# Patient Record
Sex: Female | Born: 1965 | Race: Black or African American | Hispanic: No | Smoking: Never smoker
Health system: Southern US, Community
[De-identification: ages and names within clinical notes are randomized; demographics above are authoritative.]

## PROBLEM LIST (undated history)

## (undated) DIAGNOSIS — G43909 Migraine, unspecified, not intractable, without status migrainosus: Secondary | ICD-10-CM

## (undated) DIAGNOSIS — M549 Dorsalgia, unspecified: Secondary | ICD-10-CM

## (undated) DIAGNOSIS — M1990 Unspecified osteoarthritis, unspecified site: Secondary | ICD-10-CM

## (undated) HISTORY — PX: GASTRIC BYPASS: SHX52

## (undated) HISTORY — PX: BACK SURGERY: SHX140

## (undated) HISTORY — PX: OTHER SURGICAL HISTORY: SHX169

---

## 2012-10-22 ENCOUNTER — Ambulatory Visit: Payer: Self-pay | Admitting: Emergency Medicine

## 2012-10-22 LAB — RAPID STREP-A WITH REFLX: Micro Text Report: NEGATIVE

## 2012-10-22 LAB — RAPID INFLUENZA A&B ANTIGENS

## 2012-10-25 LAB — BETA STREP CULTURE(ARMC)

## 2012-10-26 ENCOUNTER — Ambulatory Visit: Payer: Self-pay | Admitting: Emergency Medicine

## 2012-12-02 ENCOUNTER — Ambulatory Visit: Payer: Self-pay | Admitting: Physician Assistant

## 2012-12-02 LAB — RAPID STREP-A WITH REFLX: Micro Text Report: NEGATIVE

## 2012-12-04 LAB — BETA STREP CULTURE(ARMC)

## 2015-05-11 ENCOUNTER — Telehealth: Payer: Self-pay

## 2015-05-11 NOTE — Telephone Encounter (Signed)
ENCOUNTER OPENED IN ERROR

## 2015-11-14 ENCOUNTER — Ambulatory Visit
Admission: EM | Admit: 2015-11-14 | Discharge: 2015-11-14 | Disposition: A | Discharge status: Home / Self Care | Payer: Federal, State, Local not specified - PPO | Attending: Family Medicine | Admitting: Family Medicine

## 2015-11-14 DIAGNOSIS — R05 Cough: Secondary | ICD-10-CM

## 2015-11-14 DIAGNOSIS — J020 Streptococcal pharyngitis: Secondary | ICD-10-CM | POA: Diagnosis not present

## 2015-11-14 DIAGNOSIS — R059 Cough, unspecified: Secondary | ICD-10-CM

## 2015-11-14 LAB — RAPID INFLUENZA A&B ANTIGENS: Influenza A (ARMC): NOT DETECTED

## 2015-11-14 LAB — RAPID INFLUENZA A&B ANTIGENS (ARMC ONLY): INFLUENZA B (ARMC): NOT DETECTED

## 2015-11-14 LAB — RAPID STREP SCREEN (MED CTR MEBANE ONLY): STREPTOCOCCUS, GROUP A SCREEN (DIRECT): POSITIVE — AB

## 2015-11-14 MED ORDER — AZITHROMYCIN 250 MG PO TABS: ORAL_TABLET | ORAL | Status: DC

## 2015-11-14 MED ORDER — GUAIFENESIN-CODEINE 100-10 MG/5ML PO SOLN: ORAL | Status: DC

## 2015-11-14 NOTE — ED Notes (Signed)
Patient was in our office this past Monday with her two daughters who were diagnosed with strep and one was diagnosed with the flu.  She c/o cough, runny nose, headache, sore throat, but denies fever/c/n/v or chest pain.  These symptoms started Thursday.

## 2015-11-14 NOTE — ED Provider Notes (Signed)
CSN: [id number]     Arrival date & time 11/14/15  8469 History   First MD Initiated Contact with Patient 11/14/15 1036     Chief Complaint  Patient presents with  . Sore Throat  . Cough   (Consider location/radiation/quality/duration/timing/severity/associated sxs/prior Treatment) Patient is a 50 y.o. female presenting with URI. The history is provided by the patient.  URI Presenting symptoms: congestion, cough, fever, rhinorrhea and sore throat   Severity:  Moderate Onset quality:  Sudden Duration:  3 days Timing:  Constant Progression:  Worsening Chronicity:  New Relieved by:  Nothing Ineffective treatments:  OTC medications Associated symptoms: no headaches, no sinus pain, no sneezing and no wheezing   Risk factors: sick contacts (one daughter with flu and another with strep)     History reviewed. No pertinent past medical history. Past Surgical History  Procedure Laterality Date  . Gastric bypass    . Back surgery    . Left foot surgery     History reviewed. No pertinent family history. Social History  Substance Use Topics  . Smoking status: Never Smoker   . Smokeless tobacco: Never Used  . Alcohol Use: No   OB History    No data available     Review of Systems  Constitutional: Positive for fever.  HENT: Positive for congestion, rhinorrhea and sore throat. Negative for sneezing.   Respiratory: Positive for cough. Negative for wheezing.   Neurological: Negative for headaches.    Allergies  Ace inhibitors and Penicillins  Home Medications   Prior to Admission medications   Medication Sig Start Date End Date Taking? Authorizing Provider  pantoprazole (PROTONIX) 20 MG tablet Take 20 mg by mouth daily.   Yes Historical Provider, MD  topiramate (TOPAMAX) 50 MG tablet Take 50 mg by mouth 2 (two) times daily.   Yes Historical Provider, MD  zolpidem (AMBIEN) 5 MG tablet Take 5 mg by mouth at bedtime as needed for sleep.   Yes Historical Provider, MD  azithromycin  (ZITHROMAX Z-PAK) 250 MG tablet 2 tabs po once day 1, then 1 tab po qd for next 4 days 11/14/15   Payton Mccallum, MD  guaiFENesin-codeine 100-10 MG/5ML syrup 10 ml po q 8 hours prn 11/14/15   Payton Mccallum, MD  HYDROcodone-acetaminophen (NORCO/VICODIN) 5-325 MG tablet Take 1 tablet by mouth every 6 (six) hours as needed for moderate pain.    Historical Provider, MD   Meds Ordered and Administered this Visit  Medications - No data to display  BP 124/91 mmHg  Pulse 84  Temp(Src) 98.6 F (37 C) (Oral)  Resp 18  Ht 5' 4.5" (1.638 m)  Wt 220 lb (99.791 kg)  BMI 37.19 kg/m2  SpO2 100% No data found.   Physical Exam  Constitutional: She appears well-developed and well-nourished. No distress.  HENT:  Head: Normocephalic and atraumatic.  Right Ear: Tympanic membrane, external ear and ear canal normal.  Left Ear: Tympanic membrane, external ear and ear canal normal.  Nose: Rhinorrhea present. No nose lacerations, sinus tenderness, nasal deformity, septal deviation or nasal septal hematoma. No epistaxis.  No foreign bodies.  Mouth/Throat: Uvula is midline and mucous membranes are normal. Posterior oropharyngeal erythema present. No oropharyngeal exudate, posterior oropharyngeal edema or tonsillar abscesses.  Eyes: Conjunctivae and EOM are normal. Pupils are equal, round, and reactive to light. Right eye exhibits no discharge. Left eye exhibits no discharge. No scleral icterus.  Neck: Normal range of motion. Neck supple. No thyromegaly present.  Cardiovascular: Normal rate, regular rhythm  and normal heart sounds.   Pulmonary/Chest: Effort normal and breath sounds normal. No respiratory distress. She has no wheezes. She has no rales.  Lymphadenopathy:    She has no cervical adenopathy.  Skin: She is not diaphoretic.  Nursing note and vitals reviewed.   ED Course  Procedures (including critical care time)  Labs Review Labs Reviewed  RAPID STREP SCREEN (NOT AT [facility]) - Abnormal; Notable for  the following:    Streptococcus, Group A Screen (Direct) POSITIVE (*)    All other components within normal limits  RAPID INFLUENZA A&B ANTIGENS ([facility] ONLY)    Imaging Review No results found.   Visual Acuity Review  Right Eye Distance:   Left Eye Distance:   Bilateral Distance:    Right Eye Near:   Left Eye Near:    Bilateral Near:         MDM   1. Strep pharyngitis   2. Cough    Discharge Medication List as of 11/14/2015 11:09 AM    START taking these medications   Details  azithromycin (ZITHROMAX Z-PAK) 250 MG tablet 2 tabs po once day 1, then 1 tab po qd for next 4 days, Normal    guaiFENesin-codeine 100-10 MG/5ML syrup 10 ml po q 8 hours prn, Print       1. Lab result (rapid strep positive) and diagnosis reviewed with patient 2. rx as per orders above; reviewed possible side effects, interactions, risks and benefits  3. Recommend supportive treatment with otc analgesics, salt water gargles 4. Follow-up prn if symptoms worsen or don't improve    Payton Mccallum, MD 11/14/15 1114

## 2016-04-26 ENCOUNTER — Encounter: Payer: Self-pay | Admitting: *Deleted

## 2016-04-26 ENCOUNTER — Ambulatory Visit
Admission: EM | Admit: 2016-04-26 | Discharge: 2016-04-26 | Disposition: A | Discharge status: Home / Self Care | Payer: Federal, State, Local not specified - PPO | Attending: Family Medicine | Admitting: Family Medicine

## 2016-04-26 DIAGNOSIS — J069 Acute upper respiratory infection, unspecified: Secondary | ICD-10-CM

## 2016-04-26 MED ORDER — BENZONATATE 100 MG PO CAPS: 100.0000 mg | ORAL_CAPSULE | Freq: Three times a day (TID) | ORAL | 0 refills | Status: DC | PRN

## 2016-04-26 MED ORDER — HYDROCOD POLST-CPM POLST ER 10-8 MG/5ML PO SUER: 5.0000 mL | Freq: Every evening | ORAL | 0 refills | Status: DC | PRN

## 2016-04-26 NOTE — Discharge Instructions (Signed)
Take medication as prescribed. Rest. Drink plenty of fluids.  ° °Follow up with your primary care physician this week as needed. Return to Urgent care for new or worsening concerns.  ° °

## 2016-04-26 NOTE — ED Provider Notes (Signed)
[facility] ____________________________________________  Time seen: Approximately 10:15 AM  I have reviewed the triage vital signs and the nursing notes.   HISTORY  Chief Complaint Cough and Sore Throat   HPI Whitney Ware is a 50 y.o. female presents for the complaint of cough and nasal congestion 2-3 days. Patient reports some runny nose but states the cough is her biggest complaint. Patient states that cough is a dry hacking nonproductive cough. Patient reports a cough does intermittently wake her from her sleep. Denies any wheezing. Patient states that the cough is variable intermittent. Denies any chest pain or shortness of breath. Denies fevers. Reports continues to eat and drink well. Denies others are sick around her. Patient reports cough and resolved with over-the-counter Mucinex.  Denies chest pain, shortness of breath, chest pain with deep breath, abdominal pain, dysuria, neck pain, back pain, extremity pain, shortness swelling, recent sickness or recent hospitalization.   History reviewed. No pertinent past medical history.  There are no active problems to display for this patient.   Past Surgical History:  Procedure Laterality Date  . BACK SURGERY    . GASTRIC BYPASS    . Left Foot Surgery      No current facility-administered medications for this encounter.   Current Outpatient Prescriptions:  .  guaiFENesin-codeine 100-10 MG/5ML syrup, 10 ml po q 8 hours prn, Disp: 120 mL, Rfl: 0 .  HYDROcodone-acetaminophen (NORCO/VICODIN) 5-325 MG tablet, Take 1 tablet by mouth every 6 (six) hours as needed for moderate pain., Disp: , Rfl:  .  pantoprazole (PROTONIX) 20 MG tablet, Take 20 mg by mouth daily., Disp: , Rfl:  .  tiZANidine (ZANAFLEX) 2 MG tablet, Take 2 mg by mouth every 6 (six) hours as needed for muscle spasms., Disp: , Rfl:  .  topiramate (TOPAMAX) 50 MG tablet, Take 50 mg by mouth 2 (two) times daily., Disp: , Rfl:  .  zolpidem (AMBIEN) 5  MG tablet, Take 5 mg by mouth at bedtime as needed for sleep., Disp: , Rfl:  .  azithromycin (ZITHROMAX Z-PAK) 250 MG tablet, 2 tabs po once day 1, then 1 tab po qd for next 4 days, Disp: 6 each, Rfl: 0 .  benzonatate (TESSALON PERLES) 100 MG capsule, Take 1 capsule (100 mg total) by mouth 3 (three) times daily as needed for cough., Disp: 15 capsule, Rfl: 0 .  chlorpheniramine-HYDROcodone (TUSSIONEX PENNKINETIC ER) 10-8 MG/5ML SUER, Take 5 mLs by mouth at bedtime as needed for cough. do not drive or operate machinery while taking as can cause drowsiness., Disp: 100 mL, Rfl: 0  Allergies Ace inhibitors and Penicillins  Family History  Problem Relation Age of Onset  . Hypertension Mother   . Cancer Mother   . Diabetes Father   . Cancer Father     Social History Social History  Substance Use Topics  . Smoking status: Never Smoker  . Smokeless tobacco: Never Used  . Alcohol use No    Review of Systems Constitutional: No fever/chills Eyes: No visual changes. ENT: States mild scratchy throat only when coughing. As above. Cardiovascular: Denies chest pain. Respiratory: Denies shortness of breath. Gastrointestinal: No abdominal pain.  No nausea, no vomiting.  No diarrhea.  No constipation. Genitourinary: Negative for dysuria. Musculoskeletal: Negative for back pain. Skin: Negative for rash. Neurological: Negative for headaches, focal weakness or numbness.  10-point ROS otherwise negative.  ____________________________________________   PHYSICAL EXAM:  VITAL SIGNS: ED Triage Vitals  Enc Vitals Group  BP 04/26/16 0854 (!) 143/94     Pulse Rate 04/26/16 0854 81     Resp 04/26/16 0854 18     Temp 04/26/16 0854 97.9 F (36.6 C)     Temp Source 04/26/16 0854 Tympanic     SpO2 04/26/16 0854 100 %     Weight 04/26/16 0854 224 lb (101.6 kg)     Height 04/26/16 0854 5' 4.5" (1.638 m)     Head Circumference --      Peak Flow --      Pain Score 04/26/16 0858 0     Pain Loc --       Pain Edu? --      Excl. in GC? --     Constitutional: Alert and oriented. Well appearing and in no acute distress. Eyes: Conjunctivae are normal. PERRL. EOMI. Head: Atraumatic. No sinus tenderness to palpation. No swelling. No erythema.  Ears: no erythema, normal TMs bilaterally.   Nose:Nasal congestion with clear rhinorrhea  Mouth/Throat: Mucous membranes are moist. No pharyngeal erythema. No tonsillar swelling or exudate.  Neck: No stridor.  No cervical spine tenderness to palpation. Hematological/Lymphatic/Immunilogical: No cervical lymphadenopathy. Cardiovascular: Normal rate, regular rhythm. Grossly normal heart sounds.  Good peripheral circulation. Respiratory: Normal respiratory effort.  No retractions. Lungs CTAB.No wheezes, rales or rhonchi. Good air movement. Occasional dry intermittent cough noted in room. Gastrointestinal: Soft and nontender. Normal Bowel sounds. No CVA tenderness. Musculoskeletal: No lower or upper extremity tenderness nor edema. No cervical, thoracic or lumbar tenderness to palpation. No calf tenderness bilaterally. Neurologic:  Normal speech and language. No gross focal neurologic deficits are appreciated. No gait instability. Skin:  Skin is warm, dry and intact. No rash noted. Psychiatric: Mood and affect are normal. Speech and behavior are normal.  ___________________________________________   LABS (all labs ordered are listed, but only abnormal results are displayed)  Labs Reviewed - No data to display  PROCEDURES Procedures   ________________________________________   INITIAL IMPRESSION / ASSESSMENT AND PLAN / ED COURSE  Pertinent labs & imaging results that were available during my care of the patient were reviewed by me and considered in my medical decision making (see chart for details).  Well-appearing patient. No acute distress. Presents with a complaint of 2-3 days of runny nose and cough. Lungs clear throughout. Well-appearing  patient. Suspect viral upper respiratory infection. We'll treats supportively and symptomatically. Will treat with parent Tessalon Perles during the day and when necessary Tussionex at night. Encourage rest, fluids and PCP follow-up as needed.  Discussed follow up with Primary care physician this week. Discussed follow up and return parameters including no resolution or any worsening concerns. Patient verbalized understanding and agreed to plan.   ____________________________________________   FINAL CLINICAL IMPRESSION(S) / ED DIAGNOSES  Final diagnoses:  Upper respiratory infection     Discharge Medication List as of 04/26/2016  9:22 AM    START taking these medications   Details  benzonatate (TESSALON PERLES) 100 MG capsule Take 1 capsule (100 mg total) by mouth 3 (three) times daily as needed for cough., Starting Tue 04/26/2016, Normal    chlorpheniramine-HYDROcodone (TUSSIONEX PENNKINETIC ER) 10-8 MG/5ML SUER Take 5 mLs by mouth at bedtime as needed for cough. do not drive or operate machinery while taking as can cause drowsiness., Starting Tue 04/26/2016, Print        Note: This dictation was prepared with Dragon dictation along with smaller phrase technology. Any transcriptional errors that result from this process are unintentional.    Clinical  Course      Renford Dills, NP 04/26/16 1020

## 2016-04-26 NOTE — ED Triage Notes (Signed)
Patient started having symptoms of cough and sore throat 3 days ago. OTC medications have not resolved symptoms.

## 2016-09-23 ENCOUNTER — Encounter: Payer: Self-pay | Admitting: Emergency Medicine

## 2016-09-23 ENCOUNTER — Ambulatory Visit
Admission: EM | Admit: 2016-09-23 | Discharge: 2016-09-23 | Disposition: A | Discharge status: Home / Self Care | Payer: Federal, State, Local not specified - PPO | Attending: Family Medicine | Admitting: Family Medicine

## 2016-09-23 DIAGNOSIS — G43009 Migraine without aura, not intractable, without status migrainosus: Secondary | ICD-10-CM

## 2016-09-23 MED ORDER — KETOROLAC TROMETHAMINE 60 MG/2ML IM SOLN
60.0000 mg | Freq: Once | INTRAMUSCULAR | Status: AC
  Administered 2016-09-23: 60 mg via INTRAMUSCULAR

## 2016-09-23 NOTE — ED Triage Notes (Signed)
Patient c/o headache that started this morning.  Patient reports history of Migraine.  Patient states that she has already taken her migraine medicine today and has not helped. Patient denies N/V.  Patient denies light or sound sensitivity

## 2016-09-23 NOTE — ED Provider Notes (Signed)
[facility]    CSN: [id number] Arrival date & time: 09/23/16  1435     History   Chief Complaint Chief Complaint  Patient presents with  . Headache    HPI Whitney Ware is a 50 y.o. female.   50 yo female with a h/o migraines presents with a c/o migraine headache since this morning unrelieved with her usual medication. Migraine is on the left side and associated with mild photophobia. Denies any fevers, chills, vision changes, numbness/tingling.     The history is provided by the patient.  Headache    History reviewed. No pertinent past medical history.  There are no active problems to display for this patient.   Past Surgical History:  Procedure Laterality Date  . BACK SURGERY    . GASTRIC BYPASS    . Left Foot Surgery      OB History    No data available       Home Medications    Prior to Admission medications   Medication Sig Start Date End Date Taking? Authorizing Provider  baclofen (LIORESAL) 20 MG tablet Take 20 mg by mouth 3 (three) times daily.   Yes Historical Provider, MD  butalbital-aspirin-caffeine-codeine (FIORINAL WITH CODEINE) 50-325-40-30 MG capsule Take 2 capsules by mouth every 4 (four) hours as needed for pain.   Yes Historical Provider, MD  pantoprazole (PROTONIX) 20 MG tablet Take 20 mg by mouth daily.    Historical Provider, MD  topiramate (TOPAMAX) 50 MG tablet Take 50 mg by mouth 2 (two) times daily.    Historical Provider, MD  zolpidem (AMBIEN) 5 MG tablet Take 5 mg by mouth at bedtime as needed for sleep.    Historical Provider, MD    Family History Family History  Problem Relation Age of Onset  . Hypertension Mother   . Cancer Mother   . Diabetes Father   . Cancer Father     Social History Social History  Substance Use Topics  . Smoking status: Never Smoker  . Smokeless tobacco: Never Used  . Alcohol use No     Allergies   Ace inhibitors and Penicillins   Review of Systems Review of Systems    Neurological: Positive for headaches.     Physical Exam Triage Vital Signs ED Triage Vitals  Enc Vitals Group     BP 09/23/16 1453 (!) 160/93     Pulse Rate 09/23/16 1453 68     Resp 09/23/16 1453 16     Temp 09/23/16 1453 98.6 F (37 C)     Temp Source 09/23/16 1453 Oral     SpO2 09/23/16 1453 100 %     Weight 09/23/16 1451 230 lb (104.3 kg)     Height 09/23/16 1451 5\' 4"  (1.626 m)     Head Circumference --      Peak Flow --      Pain Score 09/23/16 1453 6     Pain Loc --      Pain Edu? --      Excl. in GC? --    No data found.   Updated Vital Signs BP (!) 158/95 (BP Location: Left Arm)   Pulse 68   Temp 98.6 F (37 C) (Oral)   Resp 16   Ht 5\' 4"  (1.626 m)   Wt 230 lb (104.3 kg)   SpO2 100%   BMI 39.48 kg/m   Visual Acuity Right Eye Distance:   Left Eye Distance:   Bilateral Distance:  Right Eye Near:   Left Eye Near:    Bilateral Near:     Physical Exam  Constitutional: She is oriented to person, place, and time. She appears well-developed and well-nourished. No distress.  HENT:  Head: Normocephalic and atraumatic.  Right Ear: External ear normal.  Left Ear: External ear normal.  Nose: No mucosal edema, rhinorrhea, nose lacerations, sinus tenderness, nasal deformity, septal deviation or nasal septal hematoma. No epistaxis.  No foreign bodies. Right sinus exhibits no maxillary sinus tenderness and no frontal sinus tenderness. Left sinus exhibits no maxillary sinus tenderness and no frontal sinus tenderness.  Mouth/Throat: Uvula is midline, oropharynx is clear and moist and mucous membranes are normal. No oropharyngeal exudate.  Eyes: Conjunctivae and EOM are normal. Pupils are equal, round, and reactive to light. Right eye exhibits no discharge. Left eye exhibits no discharge. No scleral icterus.  Neck: Normal range of motion. Neck supple. No thyromegaly present.  Cardiovascular: Normal rate, regular rhythm and normal heart sounds.   Pulmonary/Chest:  Effort normal and breath sounds normal. No respiratory distress. She has no wheezes. She has no rales.  Lymphadenopathy:    She has no cervical adenopathy.  Neurological: She is alert and oriented to person, place, and time. She displays normal reflexes. No cranial nerve deficit or sensory deficit. She exhibits normal muscle tone. Coordination normal.  Skin: She is not diaphoretic.  Nursing note and vitals reviewed.    UC Treatments / Results  Labs (all labs ordered are listed, but only abnormal results are displayed) Labs Reviewed - No data to display  EKG  EKG Interpretation None       Radiology No results found.  Procedures Procedures (including critical care time)  Medications Ordered in UC Medications  ketorolac (TORADOL) injection 60 mg (60 mg Intramuscular Given 09/23/16 1517)     Initial Impression / Assessment and Plan / UC Course  I have reviewed the triage vital signs and the nursing notes.  Pertinent labs & imaging results that were available during my care of the patient were reviewed by me and considered in my medical decision making (see chart for details).  Clinical Course       Final Clinical Impressions(s) / UC Diagnoses   Final diagnoses:  Migraine without aura and without status migrainosus, not intractable    New Prescriptions Discharge Medication List as of 09/23/2016  4:05 PM      1. diagnosis reviewed with patient 2. Patient given toradol 60mg  im x 1 with resolution of migraine 3. Follow-up prn if symptoms recur   Payton Mccallumrlando Melenie Minniear, MD 09/23/16 2050

## 2017-03-21 ENCOUNTER — Ambulatory Visit (INDEPENDENT_AMBULATORY_CARE_PROVIDER_SITE_OTHER): Payer: Federal, State, Local not specified - PPO

## 2017-03-21 ENCOUNTER — Ambulatory Visit
Admission: EM | Admit: 2017-03-21 | Discharge: 2017-03-21 | Disposition: A | Discharge status: Home / Self Care | Payer: Federal, State, Local not specified - PPO | Attending: Family Medicine | Admitting: Family Medicine

## 2017-03-21 DIAGNOSIS — S8002XA Contusion of left knee, initial encounter: Secondary | ICD-10-CM | POA: Diagnosis not present

## 2017-03-21 MED ORDER — NAPROXEN 500 MG PO TABS: 500.0000 mg | ORAL_TABLET | Freq: Two times a day (BID) | ORAL | 0 refills | Status: DC

## 2017-03-21 NOTE — ED Provider Notes (Signed)
CSN: [id number]     Arrival date & time 03/21/17  1605 History   First MD Initiated Contact with Patient 03/21/17 1720     Chief Complaint  Patient presents with  . Knee Pain    left    (Consider location/radiation/quality/duration/timing/severity/associated sxs/prior Treatment) HPI   This 51 year old female who presents with a left knee injury. She states that at about 6 days ago she hit her knee on a  table pedestal while pushing  pushing her chair forward. Since that time she's had pain mostly on the proximal anterior tibia. She's been elevating it, keeping ice on it and wrapping it in an Ace wrap but despite this has  been having pain that does not seem to be improving. Has been using Voltaren gel. Has anterior knee pain and indicating the anterior tibia however she stands up quickly from a sitting position or ascending stairs. Not as painful descending stairs.        History reviewed. No pertinent past medical history. Past Surgical History:  Procedure Laterality Date  . BACK SURGERY    . GASTRIC BYPASS    . Left Foot Surgery     Family History  Problem Relation Age of Onset  . Hypertension Mother   . Cancer Mother   . Diabetes Father   . Cancer Father    Social History  Substance Use Topics  . Smoking status: Never Smoker  . Smokeless tobacco: Never Used  . Alcohol use No   OB History    No data available     Review of Systems  Constitutional: Positive for activity change. Negative for chills and fatigue.  Musculoskeletal: Positive for arthralgias and gait problem.  All other systems reviewed and are negative.   Allergies  Ace inhibitors and Penicillins  Home Medications   Prior to Admission medications   Medication Sig Start Date End Date Taking? Authorizing Provider  baclofen (LIORESAL) 20 MG tablet Take 20 mg by mouth 3 (three) times daily.    [provider]  butalbital-aspirin-caffeine-codeine Benny Lennert WITH CODEINE) 50-325-40-30 MG capsule  Take 2 capsules by mouth every 4 (four) hours as needed for pain.    [provider]  naproxen (NAPROSYN) 500 MG tablet Take 1 tablet (500 mg total) by mouth 2 (two) times daily with a meal. 03/21/17   Lutricia Feil, PA-C  pantoprazole (PROTONIX) 20 MG tablet Take 20 mg by mouth daily.    [provider]  topiramate (TOPAMAX) 50 MG tablet Take 50 mg by mouth 2 (two) times daily.    [provider]  zolpidem (AMBIEN) 5 MG tablet Take 5 mg by mouth at bedtime as needed for sleep.    [provider]   Meds Ordered and Administered this Visit  Medications - No data to display  BP (!) 129/99 (BP Location: Left Arm)   Pulse 91   Temp 98.2 F (36.8 C) (Oral)   Resp 18   Ht 5' 4.5" (1.638 m)   Wt 230 lb (104.3 kg)   SpO2 100%   BMI 38.87 kg/m  No data found.   Physical Exam  Constitutional: She is oriented to person, place, and time. She appears well-developed and well-nourished. No distress.  HENT:  Head: Normocephalic.  Eyes: Pupils are equal, round, and reactive to light.  Neck: Normal range of motion.  Musculoskeletal: She exhibits tenderness.  Examination of the left knee shows no effusion present. There is no retropatellar tenderness present. There is no positive grind test.  Tenderness is sharply localized to the anterior tibia at the joint line. There is no ligamentous laxity appreciated. She has a strong quad with good control.  Neurological: She is alert and oriented to person, place, and time.  Skin: Skin is warm and dry. She is not diaphoretic.  Psychiatric: She has a normal mood and affect. Her behavior is normal. Judgment and thought content normal.  Nursing note and vitals reviewed.   Urgent Care Course     Procedures (including critical care time)  Labs Review Labs Reviewed - No data to display  Imaging Review Dg Knee Complete 4 Views Left  Result Date: 03/21/2017 CLINICAL DATA:  Struck her LEFT knee on a table last week,  persistent pain despite elevation, ice and Ace wrap, anterior pain just below patella EXAM: LEFT KNEE - COMPLETE 4+ VIEW COMPARISON:  None FINDINGS: Diffuse osseous demineralization. Minimal joint space narrowing with tiny patellar spurs. No acute fracture, dislocation, or bone destruction. No definite knee joint effusion. 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 No acute abnormalities. Electronically Signed   By: Ulyses SouthwardMark  Boles M.D.   On: 03/21/2017 18:22     Visual Acuity Review  Right Eye Distance:   Left Eye Distance:   Bilateral Distance:    Right Eye Near:   Left Eye Near:    Bilateral Near:     Patient was fitted with a left knee immobilizer.    MDM   1. Contusion of left knee, initial encounter    New Prescriptions   NAPROXEN (NAPROSYN) 500 MG TABLET    Take 1 tablet (500 mg total) by mouth 2 (two) times daily with a meal.  Plan: 1. Test/x-ray results and diagnosis reviewed with patient 2. rx as per orders; risks, benefits, potential side effects reviewed with patient 3. Recommend supportive treatment with Isometric quadriceps strengthening exercises. These were instructed to the patient. Stop using Voltaren gel and start using Naprosyn daily. The knee immobilizer for active times. Does not need to sleep with it or use it when she is quiet. If she is not improving she should follow-up with her primary care physician. 4. F/u prn if symptoms worsen or don't improve     Lutricia FeilRoemer, Donye Campanelli P, PA-C 03/21/17 1855    Lutricia FeilRoemer, Audrea Bolte P, PA-C 03/21/17 1858

## 2017-03-21 NOTE — ED Triage Notes (Signed)
Last week pt hit her left knee o a table, she has been elevating it, keeping ice on it, and wearing an ace wrap however the pain isnt getting any better.

## 2018-02-21 ENCOUNTER — Ambulatory Visit
Admission: EM | Admit: 2018-02-21 | Discharge: 2018-02-21 | Disposition: A | Discharge status: Home / Self Care | Payer: Federal, State, Local not specified - PPO | Attending: Family Medicine | Admitting: Family Medicine

## 2018-02-21 ENCOUNTER — Other Ambulatory Visit: Payer: Self-pay

## 2018-02-21 DIAGNOSIS — M79604 Pain in right leg: Secondary | ICD-10-CM

## 2018-02-21 DIAGNOSIS — M7631 Iliotibial band syndrome, right leg: Secondary | ICD-10-CM | POA: Diagnosis not present

## 2018-02-21 HISTORY — DX: Unspecified osteoarthritis, unspecified site: M19.90

## 2018-02-21 HISTORY — DX: Dorsalgia, unspecified: M54.9

## 2018-02-21 HISTORY — DX: Migraine, unspecified, not intractable, without status migrainosus: G43.909

## 2018-02-21 MED ORDER — DICLOFENAC SODIUM 1 % TD GEL: 4.0000 g | Freq: Four times a day (QID) | TRANSDERMAL | 0 refills | Status: DC

## 2018-02-21 MED ORDER — PREDNISONE 20 MG PO TABS: 20.0000 mg | ORAL_TABLET | Freq: Every day | ORAL | 0 refills | Status: DC

## 2018-02-21 NOTE — ED Triage Notes (Signed)
Pt with 2 weeks of right upper leg and right hip pain. Wakes her up at night. Denies injury. "It feels like I strained something but I haven't exercised."  Pain 5/10

## 2018-02-21 NOTE — ED Provider Notes (Signed)
[facility]    CSN: [id number] Arrival date & time: 02/21/18  0841     History   Chief Complaint Chief Complaint  Patient presents with  . Leg Pain    HPI Whitney Ware is a 52 y.o. female.   52 yo female with a c/o right upper lateral leg pain for 2 weeks. Denies any falls, injuries, swelling, rash, redness, numbness/tingling, or calf pain.  Denies any recent surgeries, immobilization, or hormonal therapy.   The history is provided by the patient.    Past Medical History:  Diagnosis Date  . Arthritis   . Back pain   . Migraines     There are no active problems to display for this patient.   Past Surgical History:  Procedure Laterality Date  . BACK SURGERY    . GASTRIC BYPASS    . Left Foot Surgery      OB History   None      Home Medications    Prior to Admission medications   Medication Sig Start Date End Date Taking? Authorizing Provider  gabapentin (NEURONTIN) 300 MG capsule Take 300 mg by mouth 3 (three) times daily as needed.   Yes [provider]  HYDROcodone-acetaminophen (NORCO/VICODIN) 5-325 MG tablet Take 1 tablet by mouth every 6 (six) hours as needed for moderate pain.   Yes [provider]  tizanidine (ZANAFLEX) 2 MG capsule Take 2 mg by mouth 3 (three) times daily.   Yes [provider]  baclofen (LIORESAL) 20 MG tablet Take 20 mg by mouth 3 (three) times daily.    [provider]  butalbital-aspirin-caffeine-codeine Benny Lennert WITH CODEINE) 50-325-40-30 MG capsule Take 2 capsules by mouth every 4 (four) hours as needed for pain.    [provider]  diclofenac sodium (VOLTAREN) 1 % GEL Apply 4 g topically 4 (four) times daily. 02/21/18   Payton Mccallum, MD  naproxen (NAPROSYN) 500 MG tablet Take 1 tablet (500 mg total) by mouth 2 (two) times daily with a meal. 03/21/17   Lutricia Feil, PA-C  pantoprazole (PROTONIX) 20 MG tablet Take 20 mg by mouth daily.    [provider]    predniSONE (DELTASONE) 20 MG tablet Take 1 tablet (20 mg total) by mouth daily. 02/21/18   Payton Mccallum, MD  topiramate (TOPAMAX) 50 MG tablet Take 50 mg by mouth 2 (two) times daily.    [provider]  zolpidem (AMBIEN) 5 MG tablet Take 5 mg by mouth at bedtime as needed for sleep.    [provider]    Family History Family History  Problem Relation Age of Onset  . Hypertension Mother   . Cancer Mother   . Diabetes Father   . Cancer Father     Social History Social History   Tobacco Use  . Smoking status: Never Smoker  . Smokeless tobacco: Never Used  Substance Use Topics  . Alcohol use: No  . Drug use: No     Allergies   Ace inhibitors and Penicillins   Review of Systems Review of Systems   Physical Exam Triage Vital Signs ED Triage Vitals  Enc Vitals Group     BP 02/21/18 0901 125/70     Pulse Rate 02/21/18 0901 73     Resp 02/21/18 0901 18     Temp 02/21/18 0901 98.2 F (36.8 C)     Temp Source 02/21/18 0901 Oral     SpO2 02/21/18 0901 100 %  Weight 02/21/18 0900 230 lb (104.3 kg)     Height 02/21/18 0900 5' 4.5" (1.638 m)     Head Circumference --      Peak Flow --      Pain Score 02/21/18 0900 5     Pain Loc --      Pain Edu? --      Excl. in GC? --    No data found.  Updated Vital Signs BP 125/70 (BP Location: Left Arm)   Pulse 73   Temp 98.2 F (36.8 C) (Oral)   Resp 18   Ht 5' 4.5" (1.638 m)   Wt 230 lb (104.3 kg)   SpO2 100%   BMI 38.87 kg/m   Visual Acuity Right Eye Distance:   Left Eye Distance:   Bilateral Distance:    Right Eye Near:   Left Eye Near:    Bilateral Near:     Physical Exam  Constitutional: She appears well-developed and well-nourished. No distress.  Musculoskeletal:       Right upper leg: She exhibits tenderness (along the IT band). She exhibits no bony tenderness, no swelling, no edema, no deformity and no laceration.  Skin: She is not diaphoretic.  Nursing note and vitals  reviewed.    UC Treatments / Results  Labs (all labs ordered are listed, but only abnormal results are displayed) Labs Reviewed - No data to display  EKG None  Radiology No results found.  Procedures Procedures (including critical care time)  Medications Ordered in UC Medications - No data to display  Initial Impression / Assessment and Plan / UC Course  I have reviewed the triage vital signs and the nursing notes.  Pertinent labs & imaging results that were available during my care of the patient were reviewed by me and considered in my medical decision making (see chart for details).      Final Clinical Impressions(s) / UC Diagnoses   Final diagnoses:  Iliotibial band syndrome of right side    ED Prescriptions    Medication Sig Dispense Auth. Provider   predniSONE (DELTASONE) 20 MG tablet Take 1 tablet (20 mg total) by mouth daily. 7 tablet Payton Mccallum, MD   diclofenac sodium (VOLTAREN) 1 % GEL Apply 4 g topically 4 (four) times daily. 100 g Payton Mccallum, MD     1. diagnosis reviewed with patient 2. rx as per orders above; reviewed possible side effects, interactions, risks and benefits; (patient states she can't take oral NSAIDS due to gastric bypass) 3. Recommend supportive treatment with ice/heat, massage, stretching 4. Follow-up prn if symptoms worsen or don't improve   Controlled Substance Prescriptions [place] Controlled Substance Registry consulted? Not Applicable   Payton Mccallum, MD 02/21/18 1426

## 2018-03-29 ENCOUNTER — Ambulatory Visit (INDEPENDENT_AMBULATORY_CARE_PROVIDER_SITE_OTHER): Payer: Federal, State, Local not specified - PPO

## 2018-03-29 ENCOUNTER — Other Ambulatory Visit: Payer: Self-pay

## 2018-03-29 ENCOUNTER — Encounter: Payer: Self-pay | Admitting: Emergency Medicine

## 2018-03-29 ENCOUNTER — Ambulatory Visit
Admission: EM | Admit: 2018-03-29 | Discharge: 2018-03-29 | Disposition: A | Discharge status: Home / Self Care | Payer: Federal, State, Local not specified - PPO | Attending: Family Medicine | Admitting: Family Medicine

## 2018-03-29 DIAGNOSIS — M1711 Unilateral primary osteoarthritis, right knee: Secondary | ICD-10-CM

## 2018-03-29 MED ORDER — NAPROXEN 500 MG PO TABS: 500.0000 mg | ORAL_TABLET | Freq: Two times a day (BID) | ORAL | 0 refills | Status: DC

## 2018-03-29 NOTE — ED Provider Notes (Signed)
[facility]    CSN: [id number] Arrival date & time: 03/29/18  1508     History   Chief Complaint Chief Complaint  Patient presents with  . Knee Pain    right    HPI Whitney Ware is a 52 y.o. female.   HPI 52 year old female presents with right lateral knee pain indicating the joint line region.  She says the pain will radiate sometimes up her lateral knee but not far.  She states that whenever she steps on her foot a wrong way or twists the wrong way she will feel knee pain.  Never locked popped or click.  Does not notice any swelling.  He has no known injury to her knee.  The patient is obese.  Does not specifically complain of hip pain.  Previously seen in our clinic on 02/21/2018 were diagnosed with an iliotibial band syndrome.  He was prescribed on his own and Voltaren gel which she states helped but did not get her completely better.  He is on a trip tomorrow to [place] planning walking tours and wanted to have relief before she left.       Past Medical History:  Diagnosis Date  . Arthritis   . Back pain   . Migraines     There are no active problems to display for this patient.   Past Surgical History:  Procedure Laterality Date  . BACK SURGERY    . GASTRIC BYPASS    . Left Foot Surgery      OB History   None      Home Medications    Prior to Admission medications   Medication Sig Start Date End Date Taking? Authorizing Provider  baclofen (LIORESAL) 20 MG tablet Take 20 mg by mouth 3 (three) times daily.   Yes [provider]  butalbital-aspirin-caffeine-codeine (FIORINAL WITH CODEINE) 50-325-40-30 MG capsule Take 2 capsules by mouth every 4 (four) hours as needed for pain.   Yes [provider]  diclofenac sodium (VOLTAREN) 1 % GEL Apply 4 g topically 4 (four) times daily. 02/21/18  Yes Payton Mccallum, MD  gabapentin (NEURONTIN) 300 MG capsule Take 300 mg by mouth 3 (three) times daily as needed.   Yes [provider]  HYDROcodone-acetaminophen (NORCO/VICODIN) 5-325 MG tablet Take 1 tablet by mouth every 6 (six) hours as needed for moderate pain.   Yes [provider]  pantoprazole (PROTONIX) 20 MG tablet Take 20 mg by mouth daily.   Yes [provider]  predniSONE (DELTASONE) 20 MG tablet Take 1 tablet (20 mg total) by mouth daily. 02/21/18  Yes Payton Mccallum, MD  tizanidine (ZANAFLEX) 2 MG capsule Take 2 mg by mouth 3 (three) times daily.   Yes [provider]  topiramate (TOPAMAX) 50 MG tablet Take 50 mg by mouth 2 (two) times daily.   Yes [provider]  zolpidem (AMBIEN) 5 MG tablet Take 5 mg by mouth at bedtime as needed for sleep.   Yes [provider]  naproxen (NAPROSYN) 500 MG tablet Take 1 tablet (500 mg total) by mouth 2 (two) times daily with a meal. 03/29/18   Lutricia Feil, PA-C    Family History Family History  Problem Relation Age of Onset  . Hypertension Mother   . Cancer Mother   . Diabetes Father   . Cancer Father     Social History Social History   Tobacco Use  . Smoking status: Never Smoker  . Smokeless  tobacco: Never Used  Substance Use Topics  . Alcohol use: No  . Drug use: No     Allergies   Ace inhibitors and Penicillins   Review of Systems Review of Systems  Constitutional: Positive for activity change. Negative for appetite change, chills, fatigue and fever.  Musculoskeletal: Positive for arthralgias and gait problem.  All other systems reviewed and are negative.    Physical Exam Triage Vital Signs ED Triage Vitals  Enc Vitals Group     BP 03/29/18 1522 (!) 139/95     Pulse Rate 03/29/18 1522 92     Resp 03/29/18 1522 18     Temp 03/29/18 1522 98.9 F (37.2 C)     Temp Source 03/29/18 1522 Oral     SpO2 03/29/18 1522 100 %     Weight 03/29/18 1523 230 lb (104.3 kg)     Height 03/29/18 1523 5\' 5"  (1.651 m)     Head Circumference --      Peak Flow --      Pain Score 03/29/18 1522 6      Pain Loc --      Pain Edu? --      Excl. in GC? --    No data found.  Updated Vital Signs BP (!) 139/95 (BP Location: Right Arm)   Pulse 92   Temp 98.9 F (37.2 C) (Oral)   Resp 18   Ht 5\' 5"  (1.651 m)   Wt 230 lb (104.3 kg)   SpO2 100%   BMI 38.27 kg/m   Visual Acuity Right Eye Distance:   Left Eye Distance:   Bilateral Distance:    Right Eye Near:   Left Eye Near:    Bilateral Near:     Physical Exam  Constitutional: She is oriented to person, place, and time. She appears well-developed and well-nourished. No distress.  HENT:  Head: Normocephalic.  Eyes: Pupils are equal, round, and reactive to light. Right eye exhibits no discharge. Left eye exhibits no discharge.  Neck: Normal range of motion.  Musculoskeletal: Normal range of motion. She exhibits tenderness.  Of the right knee shows no effusion.  Quadriceps is strong with good control.  There is no patellofemoral tenderness no retropatellar tenderness present.  She does have laxity of the lateral collateral ligament in comparison to the medial collateral ligament.  There is tenderness most noticeable along the joint line anteriorly to midportion.  Anterior drawer sign is negative.  Neurological: She is alert and oriented to person, place, and time.  Skin: Skin is warm and dry. She is not diaphoretic.  Psychiatric: She has a normal mood and affect. Her behavior is normal. Judgment and thought content normal.  Nursing note and vitals reviewed.    UC Treatments / Results  Labs (all labs ordered are listed, but only abnormal results are displayed) Labs Reviewed - No data to display  EKG None  Radiology Dg Knee Complete 4 Views Right  Result Date: 03/29/2018 CLINICAL DATA:  Pt states she has been having right lateral knee pain off and on x 1 month initially beginning with lat mid thigh pain. Pain became worse when she was shopping few days ago EXAM: RIGHT KNEE - COMPLETE 4+ VIEW COMPARISON:  None. FINDINGS:  There is degenerative change involving the MEDIAL, LATERAL, and patellofemoral compartments. No joint effusion. No acute fracture. IMPRESSION: Moderate degenerative change.  No evidence for acute  abnormality. Electronically Signed   By: Norva PavlovElizabeth  Brown M.D.   On: 03/29/2018 16:39  Procedures Procedures (including critical care time)  Medications Ordered in UC Medications - No data to display  Initial Impression / Assessment and Plan / UC Course  I have reviewed the triage vital signs and the nursing notes.  Pertinent labs & imaging results that were available during my care of the patient were reviewed by me and considered in my medical decision making (see chart for details).     Plan: 1. Test/x-ray results and diagnosis reviewed with patient 2. rx as per orders; risks, benefits, potential side effects reviewed with patient 3. Recommend supportive treatment with rest and symptom avoidance.  Use of a cane to assist and long distance ambulation may be beneficial.  We will start her on Naprosyn 500 mg twice daily.  She will follow-up with  [facility] further evaluation and treatment. 4. F/u prn if symptoms worsen or don't improve  Final Clinical Impressions(s) / UC Diagnoses   Final diagnoses:  Tricompartment osteoarthritis of right knee     Discharge Instructions     Consider using a cane for long distance ambulation.  Follow up with your orthopedic surgeon    ED Prescriptions    Medication Sig Dispense Auth. Provider   naproxen (NAPROSYN) 500 MG tablet Take 1 tablet (500 mg total) by mouth 2 (two) times daily with a meal. 60 tablet Lutricia Feil, PA-C     Controlled Substance Prescriptions [place] Controlled Substance Registry consulted? Not Applicable   Lutricia Feil, PA-C 03/29/18 1708

## 2018-03-29 NOTE — ED Triage Notes (Signed)
Patient c/o right thigh pain that radiates into her right knee, started this week. Patient was seen for this before in May and was given Prednisone and  Voltaren gel for pain and she is requesting this again as she leaves for [place] tomorrow morning.

## 2018-03-29 NOTE — Discharge Instructions (Addendum)
Consider using a cane for long distance ambulation.  Follow up with your orthopedic surgeon

## 2021-04-23 ENCOUNTER — Ambulatory Visit
Admission: EM | Admit: 2021-04-23 | Discharge: 2021-04-23 | Disposition: A | Discharge status: Home / Self Care | Payer: BLUE CROSS/BLUE SHIELD | Attending: Sports Medicine | Admitting: Sports Medicine

## 2021-04-23 ENCOUNTER — Ambulatory Visit (INDEPENDENT_AMBULATORY_CARE_PROVIDER_SITE_OTHER): Payer: BLUE CROSS/BLUE SHIELD

## 2021-04-23 ENCOUNTER — Other Ambulatory Visit: Payer: Self-pay

## 2021-04-23 ENCOUNTER — Encounter: Payer: Self-pay | Admitting: Emergency Medicine

## 2021-04-23 DIAGNOSIS — S8011XA Contusion of right lower leg, initial encounter: Secondary | ICD-10-CM

## 2021-04-23 DIAGNOSIS — S8002XA Contusion of left knee, initial encounter: Secondary | ICD-10-CM

## 2021-04-23 DIAGNOSIS — S8001XA Contusion of right knee, initial encounter: Secondary | ICD-10-CM

## 2021-04-23 DIAGNOSIS — M25562 Pain in left knee: Secondary | ICD-10-CM

## 2021-04-23 DIAGNOSIS — S8012XA Contusion of left lower leg, initial encounter: Secondary | ICD-10-CM

## 2021-04-23 DIAGNOSIS — M25561 Pain in right knee: Secondary | ICD-10-CM
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
## 2021-04-23 NOTE — Discharge Instructions (Addendum)
As we discussed, your x-rays do not show any fracture.  You do have some advanced arthritis in both knees. Since you have a pain contract, I cannot prescribe any pain medication.  You said you have plenty at home. Also since you have gastric bypass surgery although it has been 10 years, just use over-the-counter ibuprofen as needed and watch for any issues with taking this. Please see educational handouts. Please follow-up with your primary care physician next week for recheck. If your symptoms were to worsen in any way please go to the ER.
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
## 2021-08-05 ENCOUNTER — Encounter: Payer: Self-pay | Admitting: Licensed Clinical Social Worker

## 2021-08-05 ENCOUNTER — Ambulatory Visit
Admission: EM | Admit: 2021-08-05 | Discharge: 2021-08-05 | Disposition: A | Discharge status: Home / Self Care | Payer: BLUE CROSS/BLUE SHIELD | Attending: Physician Assistant | Admitting: Physician Assistant

## 2021-08-05 DIAGNOSIS — M545 Low back pain, unspecified: Secondary | ICD-10-CM

## 2021-08-05 MED ORDER — NAPROXEN 500 MG PO TABS: 500.0000 mg | ORAL_TABLET | Freq: Two times a day (BID) | ORAL | 0 refills | Status: AC
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
## 2021-08-05 NOTE — Discharge Instructions (Addendum)
Pain is consistent with muscle injury.  Continue home meds.  Ice and anti-inflammatory medication the pharmacy for you.  BACK PAIN: Stressed avoiding painful activities . RICE (REST, ICE, COMPRESSION, ELEVATION) guidelines reviewed. May alternate ice and heat. Consider use of muscle rubs, Salonpas patches, etc. Use medications as directed including muscle relaxers if prescribed. Take anti-inflammatory medications as prescribed or OTC NSAIDs/Tylenol.  F/u with PCP in 7-10 days for reexamination, and please feel free to call or return to the urgent care at any time for any questions or concerns you may have and we will be happy to help you!   BACK PAIN RED FLAGS: If the back pain acutely worsens or there are any red flag symptoms such as numbness/tingling, leg weakness, saddle anesthesia, or loss of bowel/bladder control, go immediately to the ER. Follow up with [place] as scheduled or sooner if the pain does not begin to resolve or if it worsens before the follow up
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
## 2023-03-06 ENCOUNTER — Other Ambulatory Visit: Payer: Self-pay | Admitting: Student

## 2023-03-06 DIAGNOSIS — H90A31 Mixed conductive and sensorineural hearing loss, unilateral, right ear with restricted hearing on the contralateral side: Secondary | ICD-10-CM

## 2023-03-14 ENCOUNTER — Ambulatory Visit
Admission: RE | Admit: 2023-03-14 | Discharge: 2023-03-14 | Disposition: A | Discharge status: Home / Self Care | Payer: Federal, State, Local not specified - PPO | Source: Ambulatory Visit | Attending: Student | Admitting: Student

## 2023-03-14 DIAGNOSIS — H90A31 Mixed conductive and sensorineural hearing loss, unilateral, right ear with restricted hearing on the contralateral side: Secondary | ICD-10-CM | POA: Insufficient documentation
